# Patient Record
Sex: Female | Born: 1990 | Race: Black or African American | Hispanic: No | Marital: Single | Smoking: Current some day smoker
Health system: Southern US, Community
[De-identification: ages and names within clinical notes are randomized; demographics above are authoritative.]

## PROBLEM LIST (undated history)

## (undated) DIAGNOSIS — F410 Panic disorder [episodic paroxysmal anxiety] without agoraphobia: Secondary | ICD-10-CM

---
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
## 2012-05-05 ENCOUNTER — Encounter (HOSPITAL_COMMUNITY): Payer: Self-pay | Admitting: *Deleted

## 2012-05-05 ENCOUNTER — Emergency Department (HOSPITAL_COMMUNITY): Payer: Self-pay

## 2012-05-05 ENCOUNTER — Emergency Department (HOSPITAL_COMMUNITY)
Admission: EM | Admit: 2012-05-05 | Discharge: 2012-05-05 | Disposition: A | Discharge status: Home / Self Care | Payer: Self-pay | Attending: Emergency Medicine | Admitting: Emergency Medicine

## 2012-05-05 DIAGNOSIS — F17200 Nicotine dependence, unspecified, uncomplicated: Secondary | ICD-10-CM | POA: Insufficient documentation

## 2012-05-05 DIAGNOSIS — R0789 Other chest pain: Secondary | ICD-10-CM

## 2012-05-05 LAB — POCT I-STAT, CHEM 8
BUN: 7 mg/dL (ref 6–23)
Chloride: 103 mEq/L (ref 96–112)
Creatinine, Ser: 0.8 mg/dL (ref 0.50–1.10)
Sodium: 140 mEq/L (ref 135–145)
TCO2: 27 mmol/L (ref 0–100)

## 2012-05-05 LAB — POCT I-STAT TROPONIN I: Troponin i, poc: 0.01 ng/mL (ref 0.00–0.08)

## 2012-05-05 MED ORDER — OMEPRAZOLE 20 MG PO CPDR: 20.0000 mg | DELAYED_RELEASE_CAPSULE | Freq: Every day | ORAL | Status: DC

## 2012-05-05 NOTE — ED Notes (Signed)
C/o intermittent midsternal CP x 2-3 days. Episodes last approx 1 hr, occurs while at rest. Nothing makes pain feel worse or better. Resp e/u, no distress. C/o occasional non prod cough

## 2012-05-05 NOTE — ED Notes (Signed)
Reports intermittent "sqeezing" and "tightness" to her mid chest x 2-3 days with mild sob, occ cough. No acute distress noted at triage.

## 2012-05-05 NOTE — ED Provider Notes (Signed)
History     CSN: [id number]  Arrival date & time 05/05/12  0825   First MD Initiated Contact with Patient 05/05/12 [phone]      Chief Complaint  Patient presents with  . Chest Pain     HPI Reports intermittent "sqeezing" and "tightness" to her mid chest x 2-3 days with mild sob, occ cough. No acute distress noted at triage.  History reviewed. No pertinent past medical history.  History reviewed. No pertinent past surgical history.  History reviewed. No pertinent family history.  History  Substance Use Topics  . Smoking status: Current Every Day Smoker  . Smokeless tobacco: Not on file  . Alcohol Use: Yes    OB History   Grav Para Term Preterm Abortions TAB SAB Ect Mult Living                  Review of Systems All other systems reviewed and are negative Allergies  Aspirin and Penicillins  Home Medications   Current Outpatient Rx  Name  Route  Sig  Dispense  Refill  . ibuprofen (ADVIL,MOTRIN) 200 MG tablet   Oral   Take 200 mg by mouth every 6 (six) hours as needed. For pain           BP 121/68  Pulse 76  Temp(Src) 98.4 F (36.9 C) (Oral)  Resp 18  SpO2 100%  LMP 04/10/2012  Physical Exam  Nursing note and vitals reviewed. Constitutional: She is oriented to person, place, and time. She appears well-developed and well-nourished. No distress.  HENT:  Head: Normocephalic and atraumatic.  Eyes: Pupils are equal, round, and reactive to light.  Neck: Normal range of motion.  Cardiovascular: Normal rate and intact distal pulses.   Pulmonary/Chest: No respiratory distress.  Abdominal: Normal appearance. She exhibits no distension. There is no tenderness. There is no rebound.  Musculoskeletal: Normal range of motion.  Neurological: She is alert and oriented to person, place, and time. No cranial nerve deficit.  Skin: Skin is warm and dry. No rash noted.  Psychiatric: She has a normal mood and affect. Her behavior is normal.    ED Course  Procedures  (including critical care time)  Date: 05/05/2012  Rate: 78  Rhythm: normal sinus rhythm  QRS Axis: normal  Intervals: normal  ST/T Wave abnormalities: Nonspecific T wave abnormalities  Conduction Disutrbances: none  Narrative Interpretation: Nonspecific EKG   Heart score = 2  Labs Reviewed  POCT I-STAT, CHEM 8 - Abnormal; Notable for the following:    Glucose, Bld 112 (*)    All other components within normal limits  POCT I-STAT TROPONIN I   Dg Chest 2 View  05/05/2012  *RADIOLOGY REPORT*  Clinical Data: Chest pain.  CHEST - 2 VIEW  Comparison: None.  Findings:  Cardiopericardial silhouette within normal limits. Mediastinal contours normal. Trachea midline.  No airspace disease or effusion.  IMPRESSION: No active cardiopulmonary disease.   Original Report Authenticated By: Andreas Newport, M.D.      1. Atypical chest pain       MDM          Nelia Shi, MD 05/05/12 1002

## 2012-05-05 NOTE — ED Notes (Signed)
Patient transported to X-ray 

## 2012-09-22 ENCOUNTER — Emergency Department (HOSPITAL_COMMUNITY)
Admission: EM | Admit: 2012-09-22 | Discharge: 2012-09-22 | Disposition: A | Discharge status: Home / Self Care | Payer: BC Managed Care – PPO | Attending: Emergency Medicine | Admitting: Emergency Medicine

## 2012-09-22 ENCOUNTER — Encounter (HOSPITAL_COMMUNITY): Payer: Self-pay | Admitting: *Deleted

## 2012-09-22 DIAGNOSIS — R112 Nausea with vomiting, unspecified: Secondary | ICD-10-CM

## 2012-09-22 DIAGNOSIS — Z3202 Encounter for pregnancy test, result negative: Secondary | ICD-10-CM | POA: Insufficient documentation

## 2012-09-22 DIAGNOSIS — Z880 Allergy status to penicillin: Secondary | ICD-10-CM | POA: Insufficient documentation

## 2012-09-22 DIAGNOSIS — F17200 Nicotine dependence, unspecified, uncomplicated: Secondary | ICD-10-CM | POA: Insufficient documentation

## 2012-09-22 DIAGNOSIS — R509 Fever, unspecified: Secondary | ICD-10-CM | POA: Insufficient documentation

## 2012-09-22 DIAGNOSIS — R1013 Epigastric pain: Secondary | ICD-10-CM | POA: Insufficient documentation

## 2012-09-22 DIAGNOSIS — R197 Diarrhea, unspecified: Secondary | ICD-10-CM | POA: Insufficient documentation

## 2012-09-22 LAB — CBC WITH DIFFERENTIAL/PLATELET
Basophils Absolute: 0 10*3/uL (ref 0.0–0.1)
Basophils Relative: 0 % (ref 0–1)
Eosinophils Absolute: 0 10*3/uL (ref 0.0–0.7)
Eosinophils Relative: 0 % (ref 0–5)
MCH: 29.6 pg (ref 26.0–34.0)
MCHC: 33.7 g/dL (ref 30.0–36.0)
MCV: 87.9 fL (ref 78.0–100.0)
Platelets: 287 10*3/uL (ref 150–400)
RDW: 13.4 % (ref 11.5–15.5)

## 2012-09-22 LAB — COMPREHENSIVE METABOLIC PANEL
AST: 13 U/L (ref 0–37)
CO2: 30 mEq/L (ref 19–32)
Calcium: 9.3 mg/dL (ref 8.4–10.5)
Creatinine, Ser: 0.76 mg/dL (ref 0.50–1.10)
GFR calc non Af Amer: >90 mL/min (ref >90)

## 2012-09-22 LAB — URINALYSIS, ROUTINE W REFLEX MICROSCOPIC
Glucose, UA: NEGATIVE mg/dL
Ketones, ur: NEGATIVE mg/dL
Leukocytes, UA: NEGATIVE
Nitrite: NEGATIVE
Protein, ur: NEGATIVE mg/dL
Urobilinogen, UA: 1 mg/dL (ref 0.0–1.0)

## 2012-09-22 MED ORDER — ONDANSETRON HCL 4 MG/2ML IJ SOLN
4.0000 mg | Freq: Once | INTRAMUSCULAR | Status: AC
  Administered 2012-09-22: 4 mg via INTRAVENOUS
  Filled 2012-09-22: qty 2

## 2012-09-22 MED ORDER — POTASSIUM CHLORIDE CRYS ER 20 MEQ PO TBCR
40.0000 meq | EXTENDED_RELEASE_TABLET | Freq: Once | ORAL | Status: AC
  Administered 2012-09-22: 40 meq via ORAL
  Filled 2012-09-22: qty 2

## 2012-09-22 MED ORDER — PROMETHAZINE HCL 25 MG PO TABS: 25.0000 mg | ORAL_TABLET | Freq: Four times a day (QID) | ORAL | Status: DC | PRN

## 2012-09-22 NOTE — ED Provider Notes (Signed)
Medical screening examination/treatment/procedure(s) were performed by non-physician practitioner and as supervising physician I was immediately available for consultation/collaboration.     Celene Kras, MD 09/22/12 [phone]

## 2012-09-22 NOTE — ED Notes (Signed)
Pt presents to ed with c/o n/v/d since yesterday; reports abdominal pain, sts she thinks it may be something she ate. Pt in NAD, drinking ginger ale.

## 2012-09-22 NOTE — ED Provider Notes (Signed)
CSN: [id number]     Arrival date & time 09/22/12  1550 History   First MD Initiated Contact with Patient 09/22/12 1611     No chief complaint on file.  (Consider location/radiation/quality/duration/timing/severity/associated sxs/prior Treatment) HPI Pt is a [place] female c/o n/v/d that started yesterday.  Pt reports eating chinese food and chicken wings on Thursday, 9/4, yesterday she had loss of appetite associated with nausea and 2 episodes of NBNB vomiting and 3-4 episodes of loose stools w/o blood or mucous.  Today c/o generalized abdominal aching pain, 4-5/10.  Denies dysuria, frequency, urgency or hematuria. Denies vaginal pain, discharge or itching.  LMP last week, normal per pt.  Pt is sexually active, not on birth control. Denies hx of sick contacts, recent travel or abdominal surgeries.  History reviewed. No pertinent past medical history. History reviewed. No pertinent past surgical history. No family history on file. History  Substance Use Topics  . Smoking status: Current Every Day Smoker  . Smokeless tobacco: Not on file  . Alcohol Use: Yes   OB History   Grav Para Term Preterm Abortions TAB SAB Ect Mult Living                 Review of Systems  Constitutional: Positive for fever ( subjective), chills and appetite change ( decreased). Negative for diaphoresis, fatigue and unexpected weight change.  Gastrointestinal: Positive for nausea, vomiting, abdominal pain and diarrhea. Negative for constipation, blood in stool and rectal pain.  Genitourinary: Negative for dysuria, urgency, frequency, hematuria, flank pain, vaginal bleeding, vaginal discharge, vaginal pain, menstrual problem and pelvic pain.  All other systems reviewed and are negative.    Allergies  Aspirin and Penicillins  Home Medications   Current Outpatient Rx  Name  Route  Sig  Dispense  Refill  . ibuprofen (ADVIL,MOTRIN) 200 MG tablet   Oral   Take 200 mg by mouth every 6 (six) hours as needed for  pain.         Marland Kitchen loperamide (IMODIUM) 2 MG capsule   Oral   Take 2 mg by mouth 4 (four) times daily as needed for diarrhea or loose stools.         . promethazine (PHENERGAN) 25 MG tablet   Oral   Take 1 tablet (25 mg total) by mouth every 6 (six) hours as needed for nausea.   12 tablet   0    BP 129/74  Pulse 60  Temp(Src) 98.8 F (37.1 C) (Oral)  Resp 16  SpO2 98%  LMP 08/12/2012 Physical Exam  Nursing note and vitals reviewed. Constitutional: She appears well-developed and well-nourished. No distress.  HENT:  Head: Normocephalic and atraumatic.  Eyes: Conjunctivae are normal. No scleral icterus.  Neck: Normal range of motion. Neck supple.  Cardiovascular: Normal rate, regular rhythm and normal heart sounds.   Pulmonary/Chest: Effort normal and breath sounds normal. No respiratory distress. She has no wheezes. She has no rales. She exhibits no tenderness.  Abdominal: Soft. Bowel sounds are normal. She exhibits no distension and no mass. There is tenderness in the epigastric area. There is no rebound, no guarding and no CVA tenderness.    Musculoskeletal: Normal range of motion.  Neurological: She is alert.  Skin: Skin is warm and dry. She is not diaphoretic.    ED Course  Procedures (including critical care time) Labs Review Labs Reviewed  URINALYSIS, ROUTINE W REFLEX MICROSCOPIC - Abnormal; Notable for the following:    APPearance CLOUDY (*)    All  other components within normal limits  COMPREHENSIVE METABOLIC PANEL - Abnormal; Notable for the following:    Potassium 3.3 (*)    All other components within normal limits  CBC WITH DIFFERENTIAL  POCT PREGNANCY, URINE   Imaging Review No results found.  MDM   1. Nausea vomiting and diarrhea    Pt is concerned she "ate something bad the other day."  Vitals: unremarkable, afebrile and normotensive. Pt H&P consistent with gastroenteritis however due to age and gender will check urine and urine pregnancy.  Pt is  not concerned for STDs and is not having any vaginal symptoms or pelvic pain.  Urine pregnancy-negative. UA: unremarkable.  Pelvic exam not indicated at this time.  6:52 PM Will tx with zofran then reevaluate with PO challenge.  If blood work unremarkable and pt able to pass PO challenge, will d/c home with nausea medication and reassurance.   Pt able to keep down a few oz of water.  Labs: mild hypokalemia, gave K-dur.  Pt states she feels comfortable going home.  Discharged home in stable condition, Rx: zofran.  Pt education packet on liquid and B.R.A.T diet. Return precautions provided.  Pt verbalized understanding and agreement with tx plan.      Junius Finner, PA-C 09/22/12 [phone]

## 2012-09-22 NOTE — ED Notes (Signed)
Bed: ZO10 Expected date: 09/22/12 Expected time:  Means of arrival:  Comments:

## 2012-10-22 ENCOUNTER — Encounter (HOSPITAL_COMMUNITY): Payer: Self-pay | Admitting: *Deleted

## 2012-10-22 ENCOUNTER — Emergency Department (HOSPITAL_COMMUNITY)
Admission: EM | Admit: 2012-10-22 | Discharge: 2012-10-22 | Disposition: A | Discharge status: Home / Self Care | Payer: Self-pay | Attending: Emergency Medicine | Admitting: Emergency Medicine

## 2012-10-22 ENCOUNTER — Emergency Department (HOSPITAL_COMMUNITY): Payer: Self-pay

## 2012-10-22 DIAGNOSIS — Z3202 Encounter for pregnancy test, result negative: Secondary | ICD-10-CM | POA: Insufficient documentation

## 2012-10-22 DIAGNOSIS — J029 Acute pharyngitis, unspecified: Secondary | ICD-10-CM | POA: Insufficient documentation

## 2012-10-22 DIAGNOSIS — J040 Acute laryngitis: Secondary | ICD-10-CM | POA: Insufficient documentation

## 2012-10-22 DIAGNOSIS — Z880 Allergy status to penicillin: Secondary | ICD-10-CM | POA: Insufficient documentation

## 2012-10-22 DIAGNOSIS — F17200 Nicotine dependence, unspecified, uncomplicated: Secondary | ICD-10-CM | POA: Insufficient documentation

## 2012-10-22 DIAGNOSIS — Z79899 Other long term (current) drug therapy: Secondary | ICD-10-CM | POA: Insufficient documentation

## 2012-10-22 DIAGNOSIS — J069 Acute upper respiratory infection, unspecified: Secondary | ICD-10-CM | POA: Insufficient documentation

## 2012-10-22 LAB — POCT PREGNANCY, URINE: Preg Test, Ur: NEGATIVE

## 2012-10-22 NOTE — ED Notes (Signed)
Pt is here with hoarse voice, cough for couple days, and states that she coughed up some blood tinged sputum this am.  SOB with walking

## 2012-10-22 NOTE — ED Provider Notes (Signed)
CSN: [id number]     Arrival date & time 10/22/12  1546 History   First MD Initiated Contact with Patient 10/22/12 1841     Chief Complaint  Patient presents with  . Hemoptysis  . Shortness of Breath   (Consider location/radiation/quality/duration/timing/severity/associated sxs/prior Treatment) HPI Comments: 22 year old healthy female presents to the emergency department complaining of a hoarse voice, cough and cold symptoms. Patient states she has had a cold for the past 5 days with a sore throat and congestion. Today her voice became hoarse, this morning she coughed up some blood-tinged sputum. She had a second episode of blood-tinged sputum at work. Denies chest pain or wheezing, states she only became short of breath when she walked from the bus stop to the hospital today. Denies fever, chills, nausea, vomiting or abdominal pain. States her girlfriend who she lives with is sick with similar symptoms along with others at work. She has tried taking over-the-counter Alka-Seltzer once with mild relief of her symptoms.  Patient is a 22 y.o. female presenting with shortness of breath. The history is provided by the patient.  Shortness of Breath Associated symptoms: cough and sore throat   Associated symptoms: no chest pain, no diaphoresis, no fever, no neck pain, no rash, no vomiting and no wheezing     History reviewed. No pertinent past medical history. History reviewed. No pertinent past surgical history. No family history on file. History  Substance Use Topics  . Smoking status: Current Every Day Smoker  . Smokeless tobacco: Not on file  . Alcohol Use: Yes   OB History   Grav Para Term Preterm Abortions TAB SAB Ect Mult Living                 Review of Systems  Constitutional: Negative for fever, chills, diaphoresis and appetite change.  HENT: Positive for congestion and sore throat. Negative for trouble swallowing, neck pain and neck stiffness.   Respiratory: Positive for cough  and shortness of breath. Negative for wheezing and stridor.   Cardiovascular: Negative for chest pain.  Gastrointestinal: Negative for nausea and vomiting.  Skin: Negative for rash.  All other systems reviewed and are negative.    Allergies  Aspirin and Penicillins  Home Medications   Current Outpatient Rx  Name  Route  Sig  Dispense  Refill  . ibuprofen (ADVIL,MOTRIN) 200 MG tablet   Oral   Take 200 mg by mouth every 6 (six) hours as needed for pain.         Marland Kitchen loperamide (IMODIUM) 2 MG capsule   Oral   Take 2 mg by mouth 4 (four) times daily as needed for diarrhea or loose stools.         . promethazine (PHENERGAN) 25 MG tablet   Oral   Take 1 tablet (25 mg total) by mouth every 6 (six) hours as needed for nausea.   12 tablet   0    BP 138/75  Pulse 73  Temp(Src) 98.2 F (36.8 C) (Oral)  Resp 18  SpO2 100%  LMP 10/15/2012 Physical Exam  Nursing note and vitals reviewed. Constitutional: She is oriented to person, place, and time. She appears well-developed and well-nourished. No distress.  Hoarse voice.  HENT:  Head: Normocephalic and atraumatic.  Nose: Mucosal edema present. Right sinus exhibits no maxillary sinus tenderness and no frontal sinus tenderness. Left sinus exhibits no maxillary sinus tenderness and no frontal sinus tenderness.  Mouth/Throat: Uvula is midline and mucous membranes are normal. Posterior oropharyngeal erythema  present. No oropharyngeal exudate or posterior oropharyngeal edema.  Eyes: Conjunctivae are normal.  Neck: Normal range of motion. Neck supple.  Cardiovascular: Normal rate, regular rhythm and normal heart sounds.   Pulmonary/Chest: Effort normal and breath sounds normal. No respiratory distress. She has no wheezes. She has no rales. She exhibits no tenderness.  Musculoskeletal: Normal range of motion. She exhibits no edema.  Lymphadenopathy:    She has no cervical adenopathy.  Neurological: She is alert and oriented to person,  place, and time.  Skin: Skin is warm and dry. She is not diaphoretic.  Psychiatric: She has a normal mood and affect. Her behavior is normal.    ED Course  Procedures (including critical care time) Labs Review Labs Reviewed  POCT PREGNANCY, URINE   Imaging Review Dg Chest 2 View  10/22/2012   CLINICAL DATA:  Cough for 1 week. Now with a mildly cysts. Shortness of breath.  EXAM: CHEST  2 VIEW  COMPARISON:  05/05/12  FINDINGS: The heart size and mediastinal contours are within normal limits. Both lungs are clear. The visualized skeletal structures are unremarkable.  IMPRESSION: No active cardiopulmonary disease.   Electronically Signed   By: Amie Portland M.D.   On: 10/22/2012 16:46    MDM   1. Laryngitis   2. URI (upper respiratory infection)    Patient with laryngitis and URI. She has had cold symptoms for a week. She has a hoarse voice, erythematous pharynx and nasal mucosal edema, physical exam otherwise unremarkable. CXR obtained in triage prior to patient being seen and is normal. Normal vital signs. She is well appearing and in no apparent distress. She'll be discharged home, symptomatic treatment discussed. Return precautions discussed. Patient states understanding of plan and is agreeable.    Trevor Mace, PA-C 10/22/12 1905

## 2012-10-23 NOTE — ED Provider Notes (Signed)
Medical screening examination/treatment/procedure(s) were performed by non-physician practitioner and as supervising physician I was immediately available for consultation/collaboration.    Aaryan Essman D Adlee Paar, MD 10/23/12 0004 

## 2013-03-11 ENCOUNTER — Emergency Department (HOSPITAL_COMMUNITY)
Admission: EM | Admit: 2013-03-11 | Discharge: 2013-03-12 | Disposition: A | Discharge status: Home / Self Care | Payer: Self-pay | Attending: Emergency Medicine | Admitting: Emergency Medicine

## 2013-03-11 ENCOUNTER — Encounter (HOSPITAL_COMMUNITY): Payer: Self-pay | Admitting: Emergency Medicine

## 2013-03-11 ENCOUNTER — Emergency Department (HOSPITAL_COMMUNITY): Payer: Self-pay

## 2013-03-11 DIAGNOSIS — F17200 Nicotine dependence, unspecified, uncomplicated: Secondary | ICD-10-CM | POA: Insufficient documentation

## 2013-03-11 DIAGNOSIS — M25562 Pain in left knee: Secondary | ICD-10-CM

## 2013-03-11 DIAGNOSIS — Z79899 Other long term (current) drug therapy: Secondary | ICD-10-CM | POA: Insufficient documentation

## 2013-03-11 DIAGNOSIS — M25569 Pain in unspecified knee: Secondary | ICD-10-CM | POA: Insufficient documentation

## 2013-03-11 DIAGNOSIS — Z880 Allergy status to penicillin: Secondary | ICD-10-CM | POA: Insufficient documentation

## 2013-03-11 NOTE — ED Notes (Signed)
PT reports Lt knee started to hurt on SAT. Pt stands a lot with her job .

## 2013-03-11 NOTE — ED Provider Notes (Signed)
CSN: [id number]     Arrival date & time 03/11/13  2148 History  This chart was scribed for Junius Finner, PA-C, non-physician practitioner working with Celene Kras, MD by Nicholos Johns, ED scribe. This patient was seen in room TR05C/TR05C and the patient's care was started at 11:17 PM.  Chief Complaint  Patient presents with  . Knee Pain    LT   The history is provided by the patient. No language interpreter was used.   HPI Comments: Trinidad Ingle is a 23 y.o. female who presents to the Emergency Department complaining of throbbing left knee pain, onset 2 days ago. Rates pain at 6/10 when hurting. Says this same pain occurs just about every month and will last a few days before subsiding and then return again. Denies any surgery or recent trauma to left knee. Pt has been using an ace wrap and icy hot for pain and says that has been providing some relief. 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 Denies fever, nausea, or vomiting. Denies pain at this time. No hx of knee surgery or gout.    History reviewed. No pertinent past medical history. History reviewed. No pertinent past surgical history. No family history on file. History  Substance Use Topics  . Smoking status: Current Some Day Smoker  . Smokeless tobacco: Not on file  . Alcohol Use: Yes   OB History   Grav Para Term Preterm Abortions TAB SAB Ect Mult Living                 Review of Systems  Constitutional: Negative for fever.  Gastrointestinal: Negative for nausea and vomiting.  Musculoskeletal: Positive for arthralgias.  All other systems reviewed and are negative.   Allergies  Aspirin; Penicillins; and Sulfa antibiotics  Home Medications   Current Outpatient Rx  Name  Route  Sig  Dispense  Refill  . ibuprofen (ADVIL,MOTRIN) 200 MG tablet   Oral   Take 200 mg by mouth every 6 (six) hours as needed for pain.         Marland Kitchen loperamide (IMODIUM) 2 MG capsule   Oral   Take 2 mg by mouth 4 (four)  times daily as needed for diarrhea or loose stools.          Triage Vitals: BP 125/84  Pulse 66  Temp(Src) 97.6 F (36.4 C) (Oral)  Resp 12  Ht 5\' 6"  (1.676 m)  Wt 193 lb (87.544 kg)  BMI 31.17 kg/m2  SpO2 100%  LMP 02/11/2013 Physical Exam  Nursing note and vitals reviewed. Constitutional: She is oriented to person, place, and time. She appears well-developed and well-nourished.  HENT:  Head: Normocephalic and atraumatic.  Eyes: EOM are normal.  Neck: Normal range of motion.  Cardiovascular: Normal rate.   Pulmonary/Chest: Effort normal.  Musculoskeletal: Normal range of motion. She exhibits tenderness. She exhibits no edema.  Mild tenderness at medial joint space. No edema or erythema. FROM. No crepitus. DP-2+  Normal gait.  Neurological: She is alert and oriented to person, place, and time.  Skin: Skin is warm and dry.  Psychiatric: She has a normal mood and affect. Her behavior is normal.   ED Course  Procedures (including critical care time) DIAGNOSTIC STUDIES: Oxygen Saturation is 100% onRA, normal by my interpretation.    COORDINATION OF CARE: At 11:20 PM: Discussed treatment plan with patient which includes left knee x-ray. Patient agrees.   Labs Review Labs Reviewed -  No data to display Imaging Review Dg Knee Complete 4 Views Left  03/12/2013   CLINICAL DATA:  Intermittent left knee pain.  EXAM: LEFT KNEE - COMPLETE 4+ VIEW  COMPARISON:  None.  FINDINGS: There is no evidence of fracture or dislocation. The joint spaces are preserved. No significant degenerative change is seen; the patellofemoral joint is grossly unremarkable in appearance.  No significant joint effusion is seen. The visualized soft tissues are normal in appearance.  IMPRESSION: No evidence of fracture or dislocation.   Electronically Signed   By: Roanna RaiderJeffery  Chang M.D.   On: 03/12/2013 00:03    EKG Interpretation   None       MDM   Final diagnoses:  Left knee pain   Pt presenting with  left knee pain. Appears to be musculoskeletal in nature as pt denies trauma. Plain films: no acute findings.  Discussed R.I.C.E therapy and heat therapy with pt. Advised to use acetaminophen and ibuprofen as needed for pain. F/u with PCP as needed for continued pain. Pt verbalized understanding and agreement with tx plan.  I personally performed the services described in this documentation, which was scribed in my presence. The recorded information has been reviewed and is accurate.      Junius FinnerErin O'Malley, PA-C 03/12/13 0028

## 2013-03-12 NOTE — ED Provider Notes (Signed)
Medical screening examination/treatment/procedure(s) were performed by non-physician practitioner and as supervising physician I was immediately available for consultation/collaboration.    Deloma Spindle R Lexine Jaspers, MD 03/12/13 1535 

## 2013-03-12 NOTE — Discharge Instructions (Signed)
Take tylenol and ibuprofen as needed for knee pain. You may alternate ice and heat as needed for pain.  Follow up with primary care as needed for continued pain.

## 2013-10-31 ENCOUNTER — Encounter (HOSPITAL_COMMUNITY): Payer: Self-pay | Admitting: Emergency Medicine

## 2013-10-31 ENCOUNTER — Emergency Department (HOSPITAL_COMMUNITY)
Admission: EM | Admit: 2013-10-31 | Discharge: 2013-10-31 | Discharge status: Against Medical Advice | Payer: Self-pay | Attending: Emergency Medicine | Admitting: Emergency Medicine

## 2013-10-31 DIAGNOSIS — Y9389 Activity, other specified: Secondary | ICD-10-CM | POA: Insufficient documentation

## 2013-10-31 DIAGNOSIS — R232 Flushing: Secondary | ICD-10-CM | POA: Insufficient documentation

## 2013-10-31 DIAGNOSIS — Z720 Tobacco use: Secondary | ICD-10-CM | POA: Insufficient documentation

## 2013-10-31 DIAGNOSIS — Y9289 Other specified places as the place of occurrence of the external cause: Secondary | ICD-10-CM | POA: Insufficient documentation

## 2013-10-31 DIAGNOSIS — T518X1A Toxic effect of other alcohols, accidental (unintentional), initial encounter: Secondary | ICD-10-CM | POA: Insufficient documentation

## 2013-10-31 DIAGNOSIS — F410 Panic disorder [episodic paroxysmal anxiety] without agoraphobia: Secondary | ICD-10-CM | POA: Insufficient documentation

## 2013-10-31 HISTORY — DX: Panic disorder (episodic paroxysmal anxiety): F41.0

## 2013-10-31 NOTE — ED Notes (Signed)
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

## 2014-04-10 IMAGING — CR DG CHEST 2V
2 series · 2 of 2 positions shown · non-contrast
Comparison: 05/05/12

CLINICAL DATA: Cough for 1 week. Now with a mildly cysts. Shortness
of breath.

EXAM:
CHEST  2 VIEW

[w chest pa]
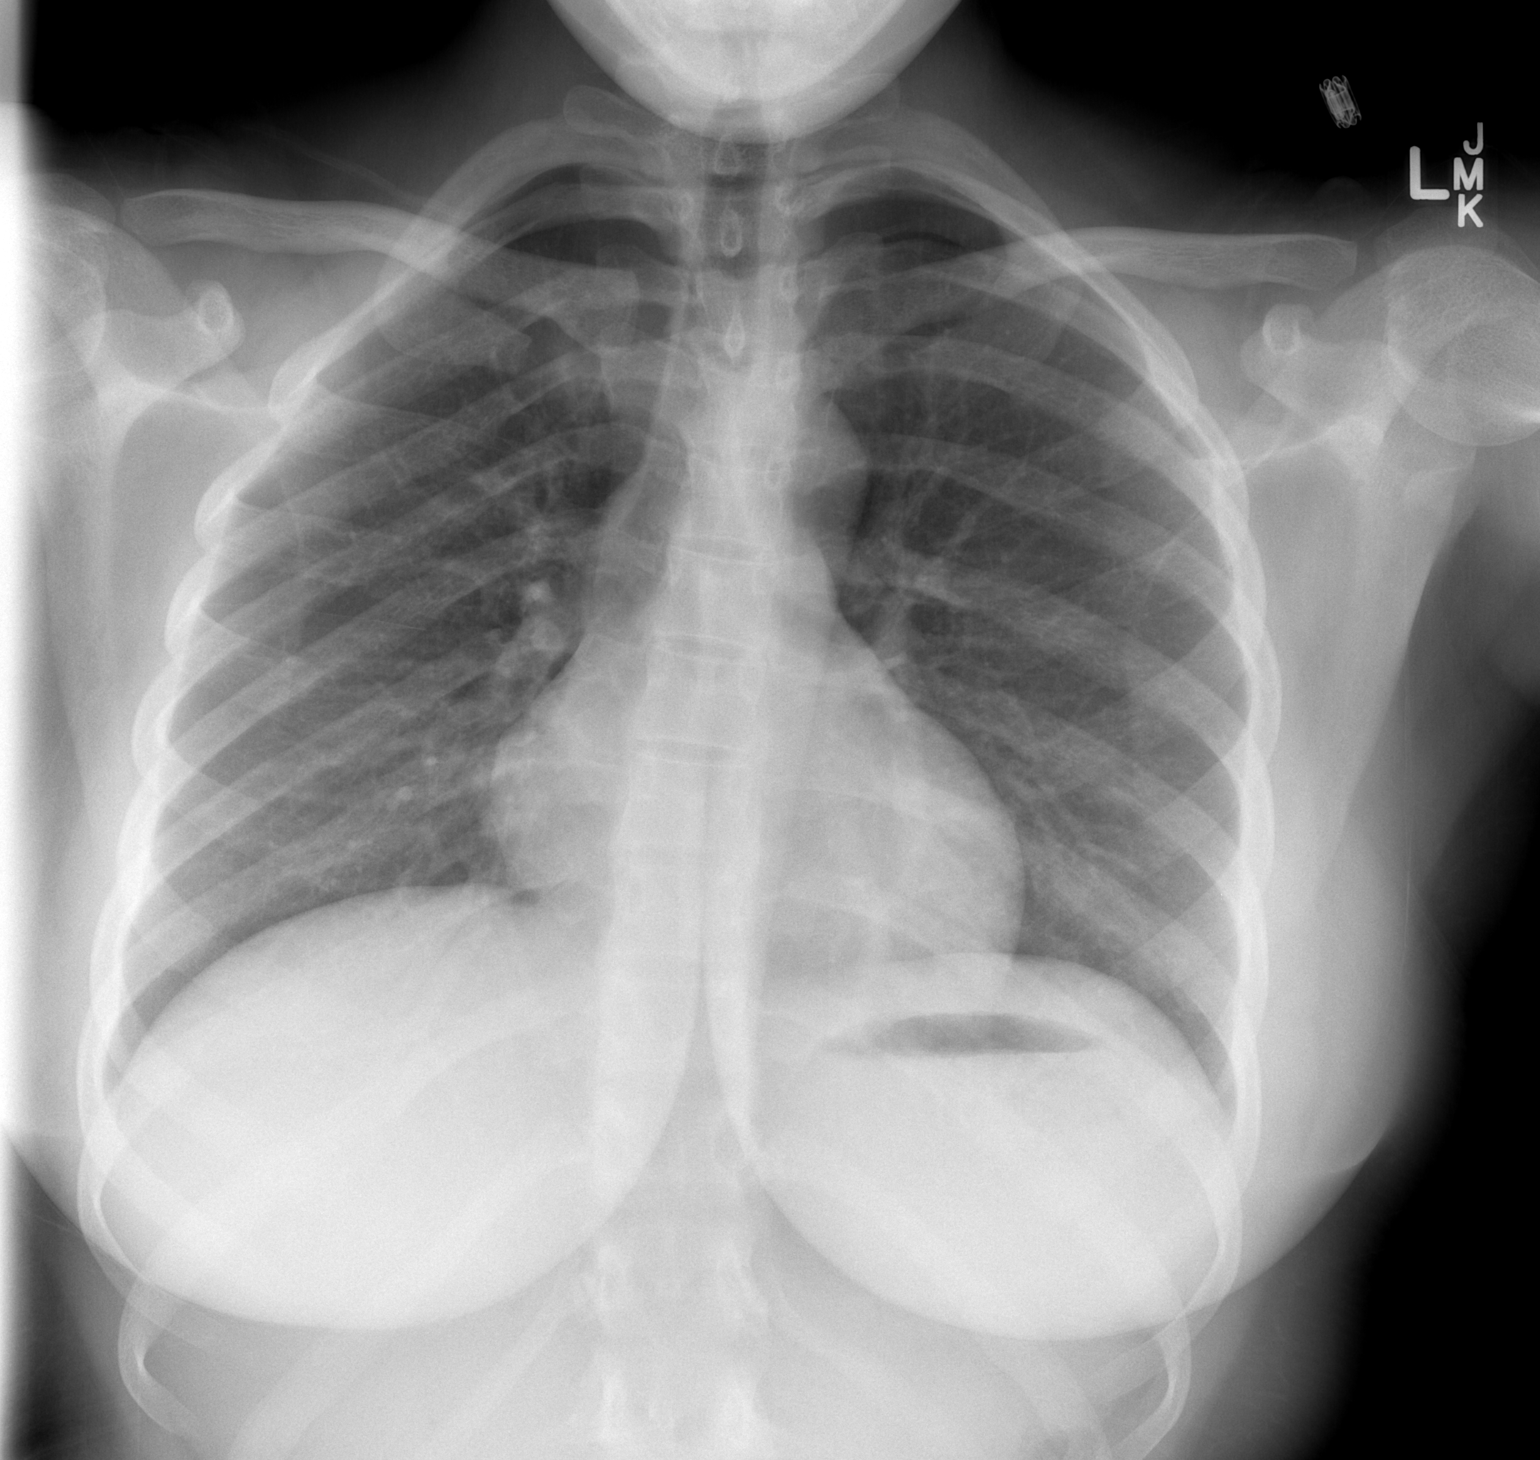

[w chest lat]
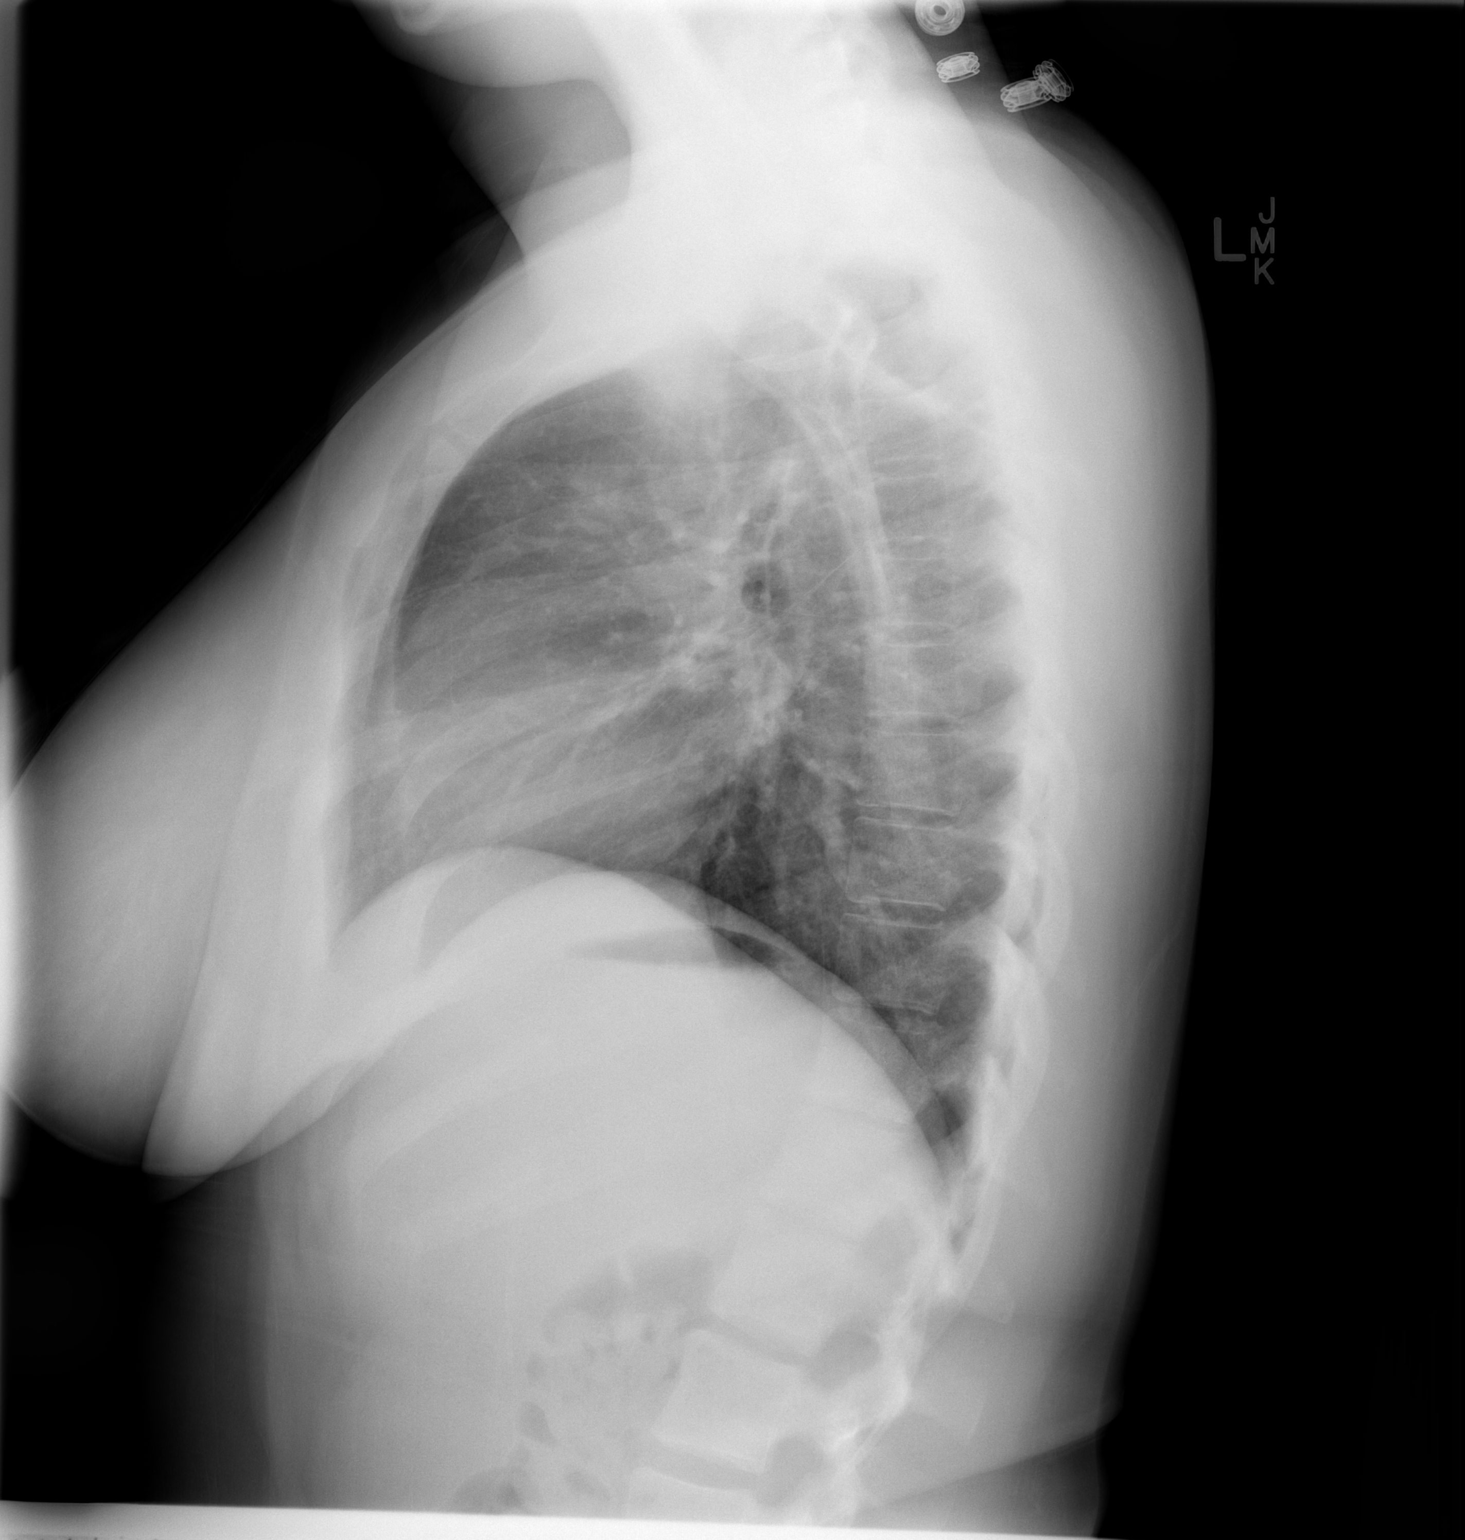

[2 of 2 positions shown; findings below may reference images not displayed]

FINDINGS: The heart size and mediastinal contours are within normal limits.
Both lungs are clear. The visualized skeletal structures are
unremarkable.
IMPRESSION: No active cardiopulmonary disease.

## 2014-05-24 ENCOUNTER — Encounter (HOSPITAL_COMMUNITY): Payer: Self-pay | Admitting: Emergency Medicine

## 2014-05-24 ENCOUNTER — Emergency Department (HOSPITAL_COMMUNITY)
Admission: EM | Admit: 2014-05-24 | Discharge: 2014-05-24 | Disposition: A | Discharge status: Home / Self Care | Payer: Self-pay | Attending: Emergency Medicine | Admitting: Emergency Medicine

## 2014-05-24 DIAGNOSIS — Z720 Tobacco use: Secondary | ICD-10-CM | POA: Insufficient documentation

## 2014-05-24 DIAGNOSIS — G43809 Other migraine, not intractable, without status migrainosus: Secondary | ICD-10-CM | POA: Insufficient documentation

## 2014-05-24 DIAGNOSIS — Z880 Allergy status to penicillin: Secondary | ICD-10-CM | POA: Insufficient documentation

## 2014-05-24 DIAGNOSIS — Z8659 Personal history of other mental and behavioral disorders: Secondary | ICD-10-CM | POA: Insufficient documentation

## 2014-05-24 MED ORDER — METOCLOPRAMIDE HCL 10 MG PO TABS: 10.0000 mg | ORAL_TABLET | Freq: Four times a day (QID) | ORAL | Status: AC | PRN

## 2014-05-24 MED ORDER — SODIUM CHLORIDE 0.9 % IV BOLUS (SEPSIS)
1000.0000 mL | Freq: Once | INTRAVENOUS | Status: AC
  Administered 2014-05-24: 1000 mL via INTRAVENOUS

## 2014-05-24 MED ORDER — METOCLOPRAMIDE HCL 5 MG/ML IJ SOLN
10.0000 mg | Freq: Once | INTRAMUSCULAR | Status: AC
  Administered 2014-05-24: 10 mg via INTRAVENOUS
  Filled 2014-05-24: qty 2

## 2014-05-24 MED ORDER — NAPROXEN 500 MG PO TABS: 500.0000 mg | ORAL_TABLET | Freq: Two times a day (BID) | ORAL | Status: AC

## 2014-05-24 MED ORDER — KETOROLAC TROMETHAMINE 30 MG/ML IJ SOLN
30.0000 mg | Freq: Once | INTRAMUSCULAR | Status: AC
  Administered 2014-05-24: 30 mg via INTRAVENOUS
  Filled 2014-05-24: qty 1

## 2014-05-24 MED ORDER — DIPHENHYDRAMINE HCL 50 MG/ML IJ SOLN
25.0000 mg | Freq: Once | INTRAMUSCULAR | Status: AC
  Administered 2014-05-24: 25 mg via INTRAVENOUS
  Filled 2014-05-24: qty 1

## 2014-05-24 NOTE — ED Notes (Signed)
Pt states that she began having a headache yesterday and vomited once.  States that she has been having dizziness today.  States that she was having nasal congestion before this happened and thinks it may be related.

## 2014-05-24 NOTE — ED Provider Notes (Signed)
CSN: [id number]     Arrival date & time 05/24/14  1031 History   First MD Initiated Contact with Patient 05/24/14 1136     Chief Complaint  Patient presents with  . Headache  . Dizziness     HPI  Patient presents tonight with patient list at headache. Tissue gets occasional headaches. Somatoform diagnosis of migraine headache. The last 24 hours she has had pain around her left eye. Not sudden in onset. Was slow. Throbbing. Like pins and needles". She has had headaches before. But has not had the associated symptoms she has now including photophobia, and some mild nausea. Vomited yesterday.  No fevers chills. No neck stiffness. Denies pregnancy.  Past Medical History  Diagnosis Date  . Panic attack    History reviewed. No pertinent past surgical history. History reviewed. No pertinent family history. History  Substance Use Topics  . Smoking status: Current Some Day Smoker  . Smokeless tobacco: Not on file  . Alcohol Use: Yes     Comment: Socially   OB History    No data available     Review of Systems  Constitutional: Negative for fever, chills, diaphoresis, appetite change and fatigue.  HENT: Negative for mouth sores, sore throat and trouble swallowing.   Eyes: Positive for photophobia. Negative for visual disturbance.  Respiratory: Negative for cough, chest tightness, shortness of breath and wheezing.   Cardiovascular: Negative for chest pain.  Gastrointestinal: Positive for nausea. Negative for vomiting, abdominal pain, diarrhea and abdominal distention.  Endocrine: Negative for polydipsia, polyphagia and polyuria.  Genitourinary: Negative for dysuria, frequency and hematuria.  Musculoskeletal: Negative for gait problem.  Skin: Negative for color change, pallor and rash.  Neurological: Positive for headaches. Negative for dizziness, syncope and light-headedness.  Hematological: Does not bruise/bleed easily.  Psychiatric/Behavioral: Negative for behavioral problems and  confusion.      Allergies  Aspirin; Penicillins; and Sulfa antibiotics  Home Medications   Prior to Admission medications   Medication Sig Start Date End Date Taking? Authorizing Provider  ibuprofen (ADVIL,MOTRIN) 200 MG tablet Take 400-800 mg by mouth every 6 (six) hours as needed for moderate pain.    Yes Historical Provider, MD   BP 112/68 mmHg  Pulse 64  Temp(Src) 98.3 F (36.8 C) (Oral)  Resp 17  SpO2 100%  LMP 05/10/2014 (Approximate) Physical Exam  Constitutional: She is oriented to person, place, and time. She appears well-developed and well-nourished. No distress.  HENT:  Head: Normocephalic.  Eyes: Conjunctivae are normal. Pupils are equal, round, and reactive to light. No scleral icterus.  Neck: Normal range of motion. Neck supple. No thyromegaly present.  Cardiovascular: Normal rate and regular rhythm.  Exam reveals no gallop and no friction rub.   No murmur heard. Pulmonary/Chest: Effort normal and breath sounds normal. No respiratory distress. She has no wheezes. She has no rales.  Abdominal: Soft. Bowel sounds are normal. She exhibits no distension. There is no tenderness. There is no rebound.  Musculoskeletal: Normal range of motion.  Neurological: She is alert and oriented to person, place, and time.  Skin: Skin is warm and dry. No rash noted.  Psychiatric: She has a normal mood and affect. Her behavior is normal.    ED Course  Procedures (including critical care time) Labs Review Labs Reviewed - No data to display  Imaging Review No results found.   EKG Interpretation None      MDM   Final diagnoses:  Other migraine without status migrainosus, not intractable  On recheck patient symptoms resolved. Eating and drinking without difficulty. Plan is discharge home. When necessary meds as needed.  Rolland PorterMark Treven Holtman, MD 05/24/14 [phone]

## 2014-05-24 NOTE — Discharge Instructions (Signed)

## 2014-05-24 NOTE — ED Notes (Signed)
Bed: WA01 Expected date:  Expected time:  Means of arrival:  Comments: 

## 2014-05-24 NOTE — ED Notes (Signed)
Patient comes from home with c/o diffuse head pain since yesterday with history of same several times a week.  Patient describes pain as 2/10 currently and throbbing in nature.  Patient states pain is worse when she moves her head.  Patient also c/o feeling light headed.  Patient also endorses mild nausea, but is eating a sandwich upon RN's entry to room.  Patient denies photophobia.  On exam, lung sounds clear, heart sounds WNL.  PERRL 4 mm, no astigmatism noted.

## 2014-08-28 IMAGING — CR DG KNEE COMPLETE 4+V*L*
4 series · 4 of 4 positions shown · non-contrast
Comparison: None.

CLINICAL DATA: Intermittent left knee pain.

EXAM:
LEFT KNEE - COMPLETE 4+ VIEW

[t knee ap left]
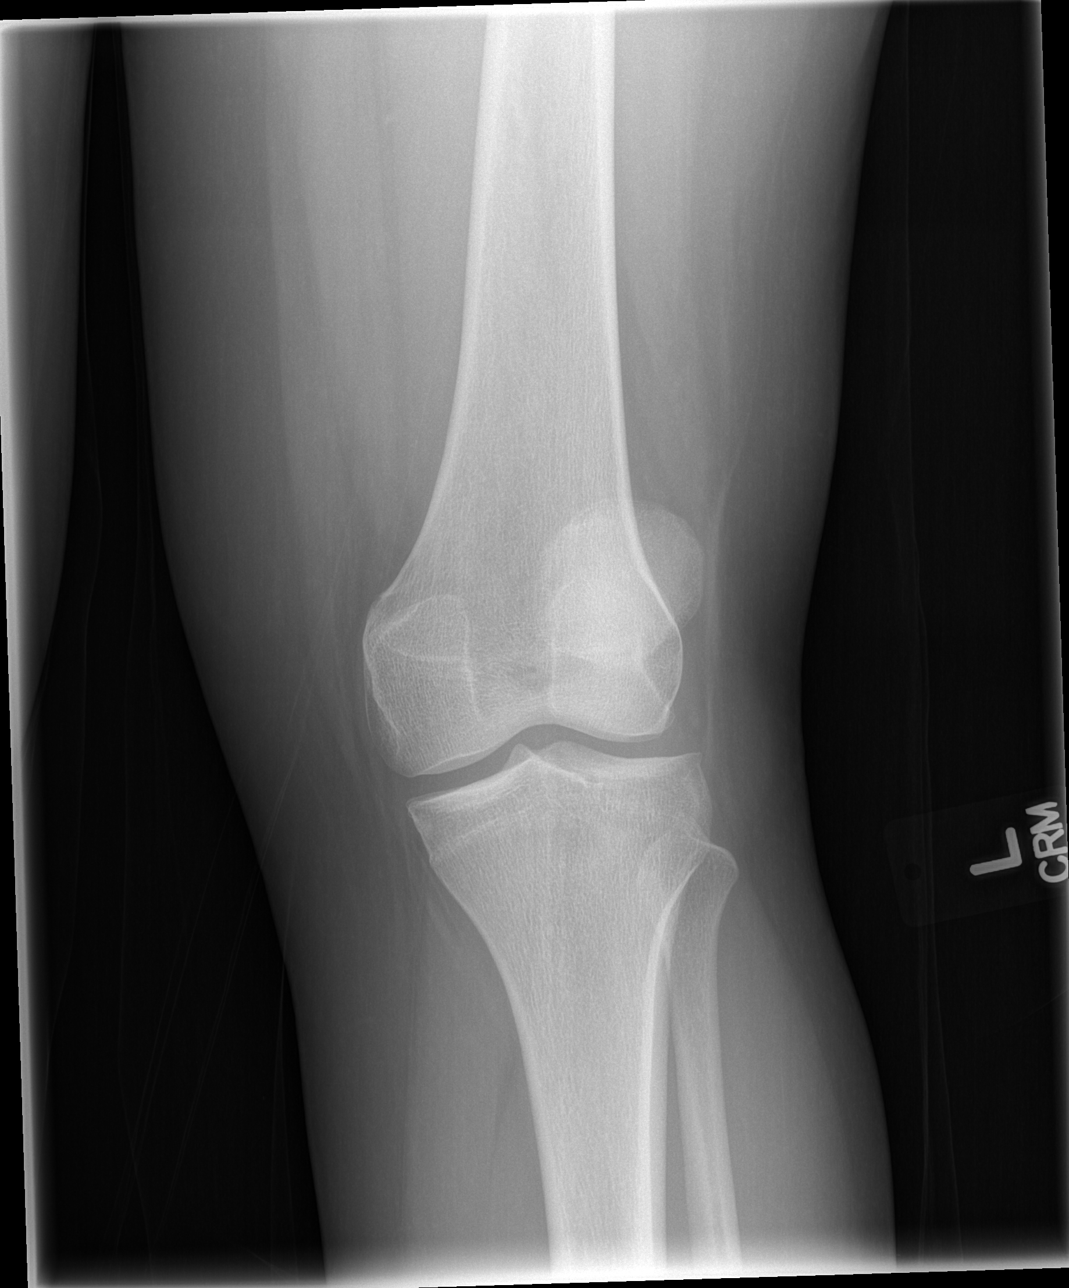

[t knee oblique left (1 of 2)]
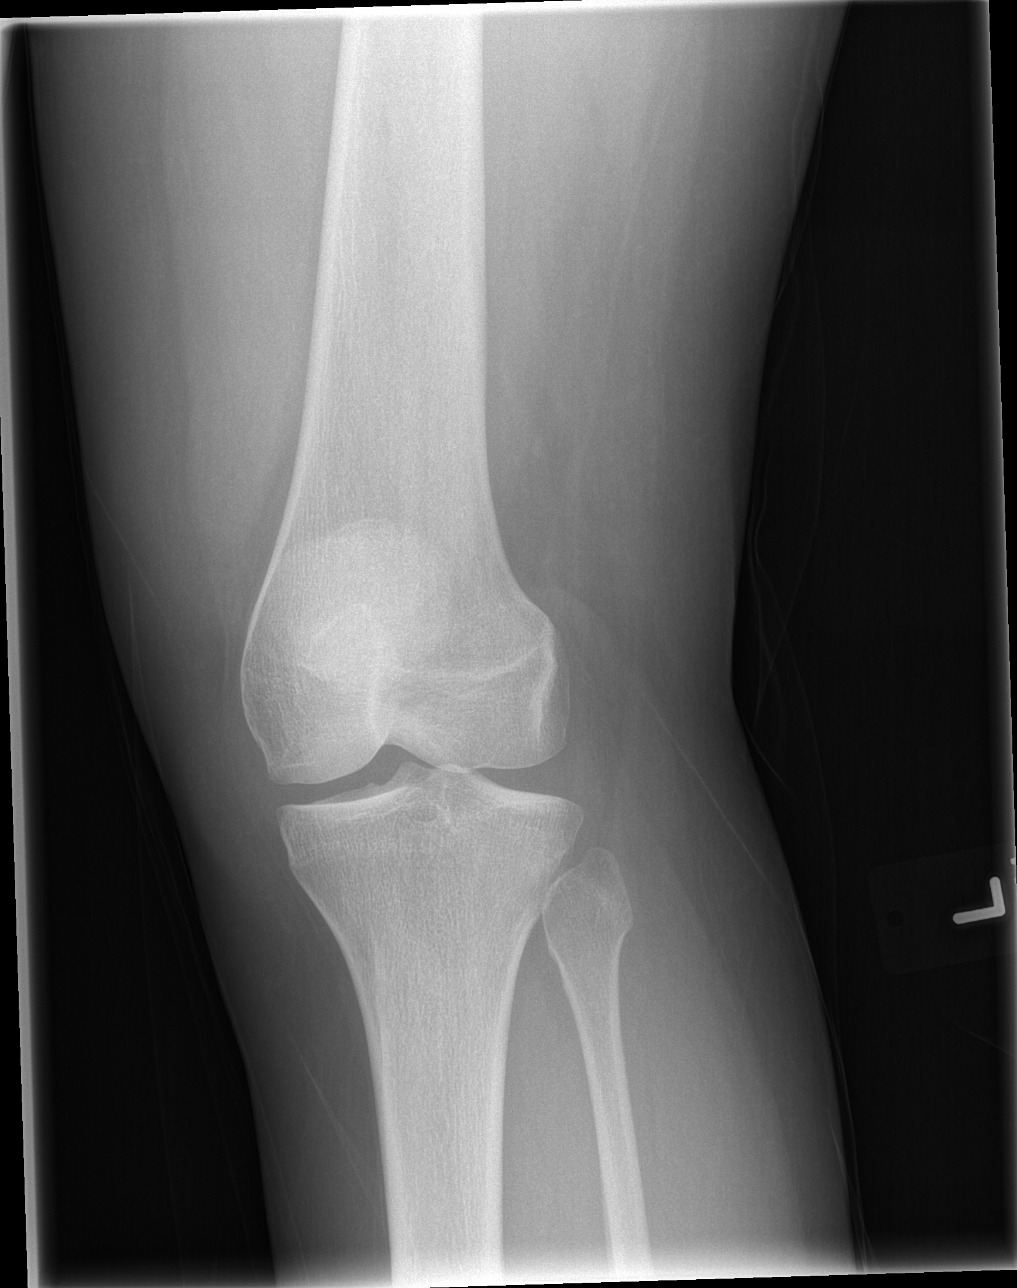

[t knee oblique left (2 of 2)]
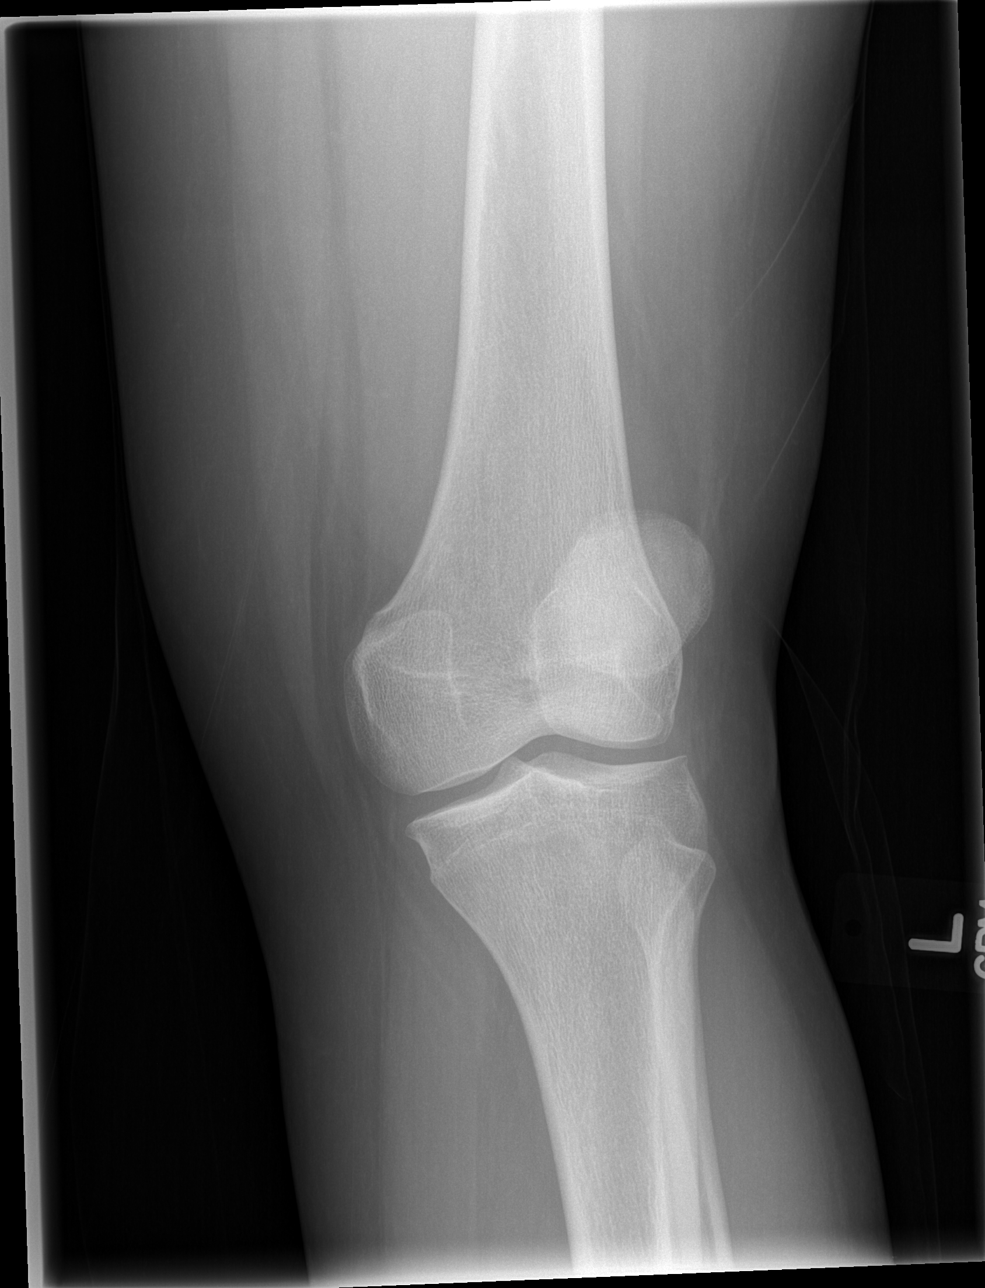

[t knee lat left]
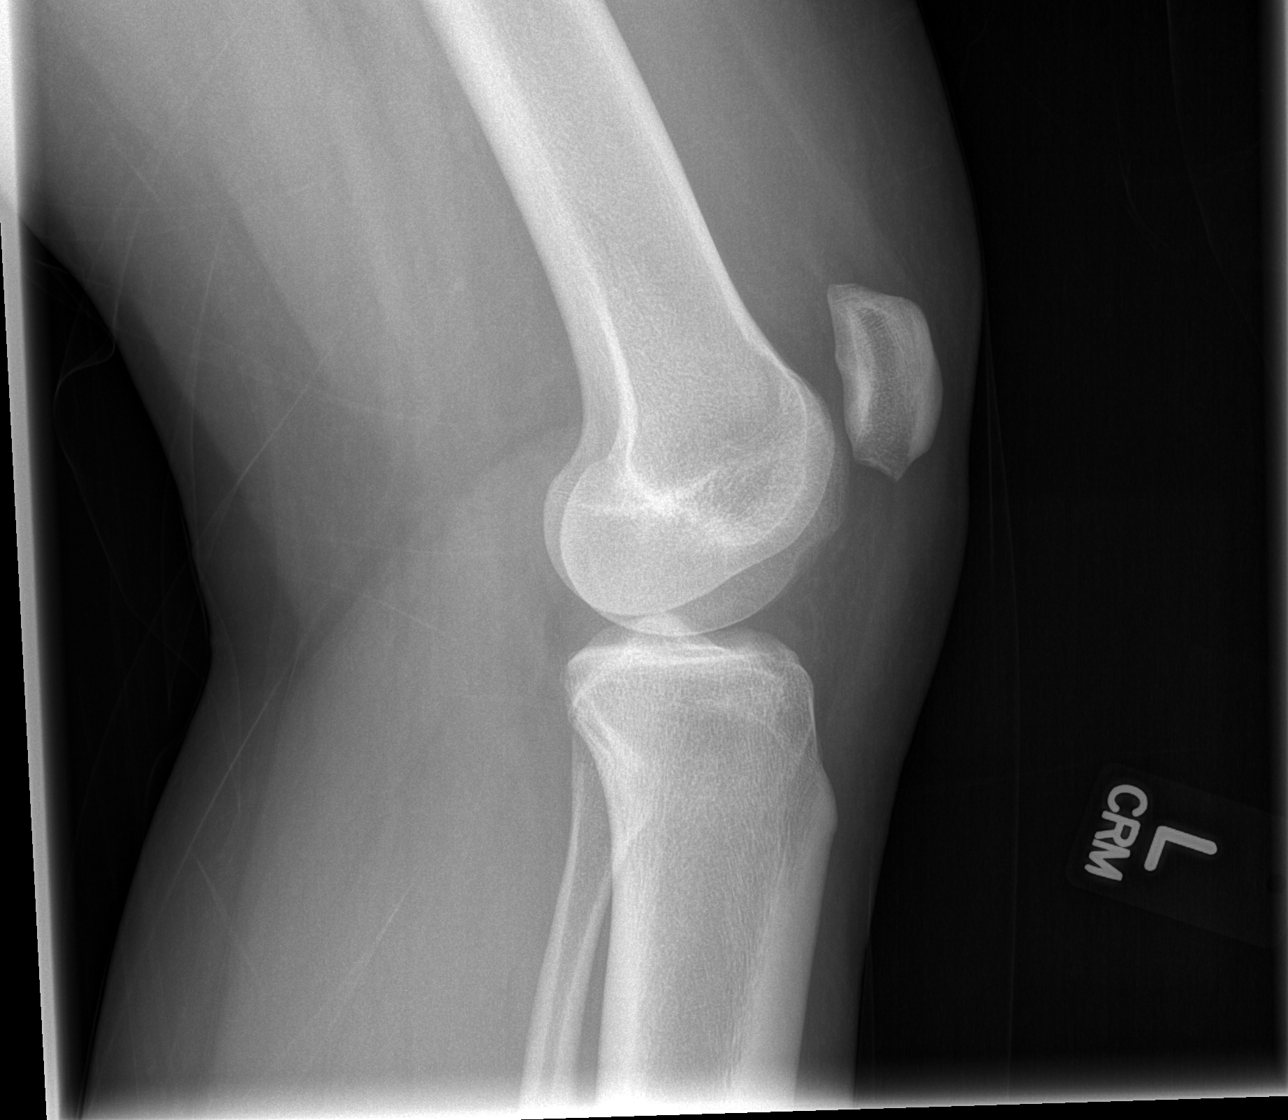

[4 of 4 positions shown; findings below may reference images not displayed]

FINDINGS: There is no evidence of fracture or dislocation. The joint spaces
are preserved. No significant degenerative change is seen; the
patellofemoral joint is grossly unremarkable in appearance.

No significant joint effusion is seen. The visualized soft tissues
are normal in appearance.
IMPRESSION: No evidence of fracture or dislocation.
